# Patient Record
Sex: Male | Born: 1980 | Race: White | Hispanic: No | Marital: Single
Health system: Southern US, Community
[De-identification: ages and names within clinical notes are randomized; demographics above are authoritative.]

## PROBLEM LIST (undated history)

## (undated) DIAGNOSIS — F429 Obsessive-compulsive disorder, unspecified: Secondary | ICD-10-CM

## (undated) DIAGNOSIS — F319 Bipolar disorder, unspecified: Secondary | ICD-10-CM

## (undated) DIAGNOSIS — F411 Generalized anxiety disorder: Secondary | ICD-10-CM

## (undated) HISTORY — DX: Bipolar disorder, unspecified: F31.9

## (undated) HISTORY — DX: Obsessive-compulsive disorder, unspecified: F42.9

## (undated) HISTORY — DX: Generalized anxiety disorder: F41.1

---

## 2009-09-29 ENCOUNTER — Emergency Department (HOSPITAL_BASED_OUTPATIENT_CLINIC_OR_DEPARTMENT_OTHER): Admission: EM | Admit: 2009-09-29 | Discharge: 2009-09-29 | Payer: Self-pay | Admitting: Emergency Medicine

## 2009-09-29 ENCOUNTER — Ambulatory Visit: Payer: Self-pay | Admitting: Diagnostic Radiology

## 2011-01-16 IMAGING — CR DG CHEST 2V
2 series · 2 of 2 positions shown · non-contrast
Comparison: None

CLINICAL DATA: Fever, cough, body aches, smoker

CHEST - 2 VIEW

[w chest pa]
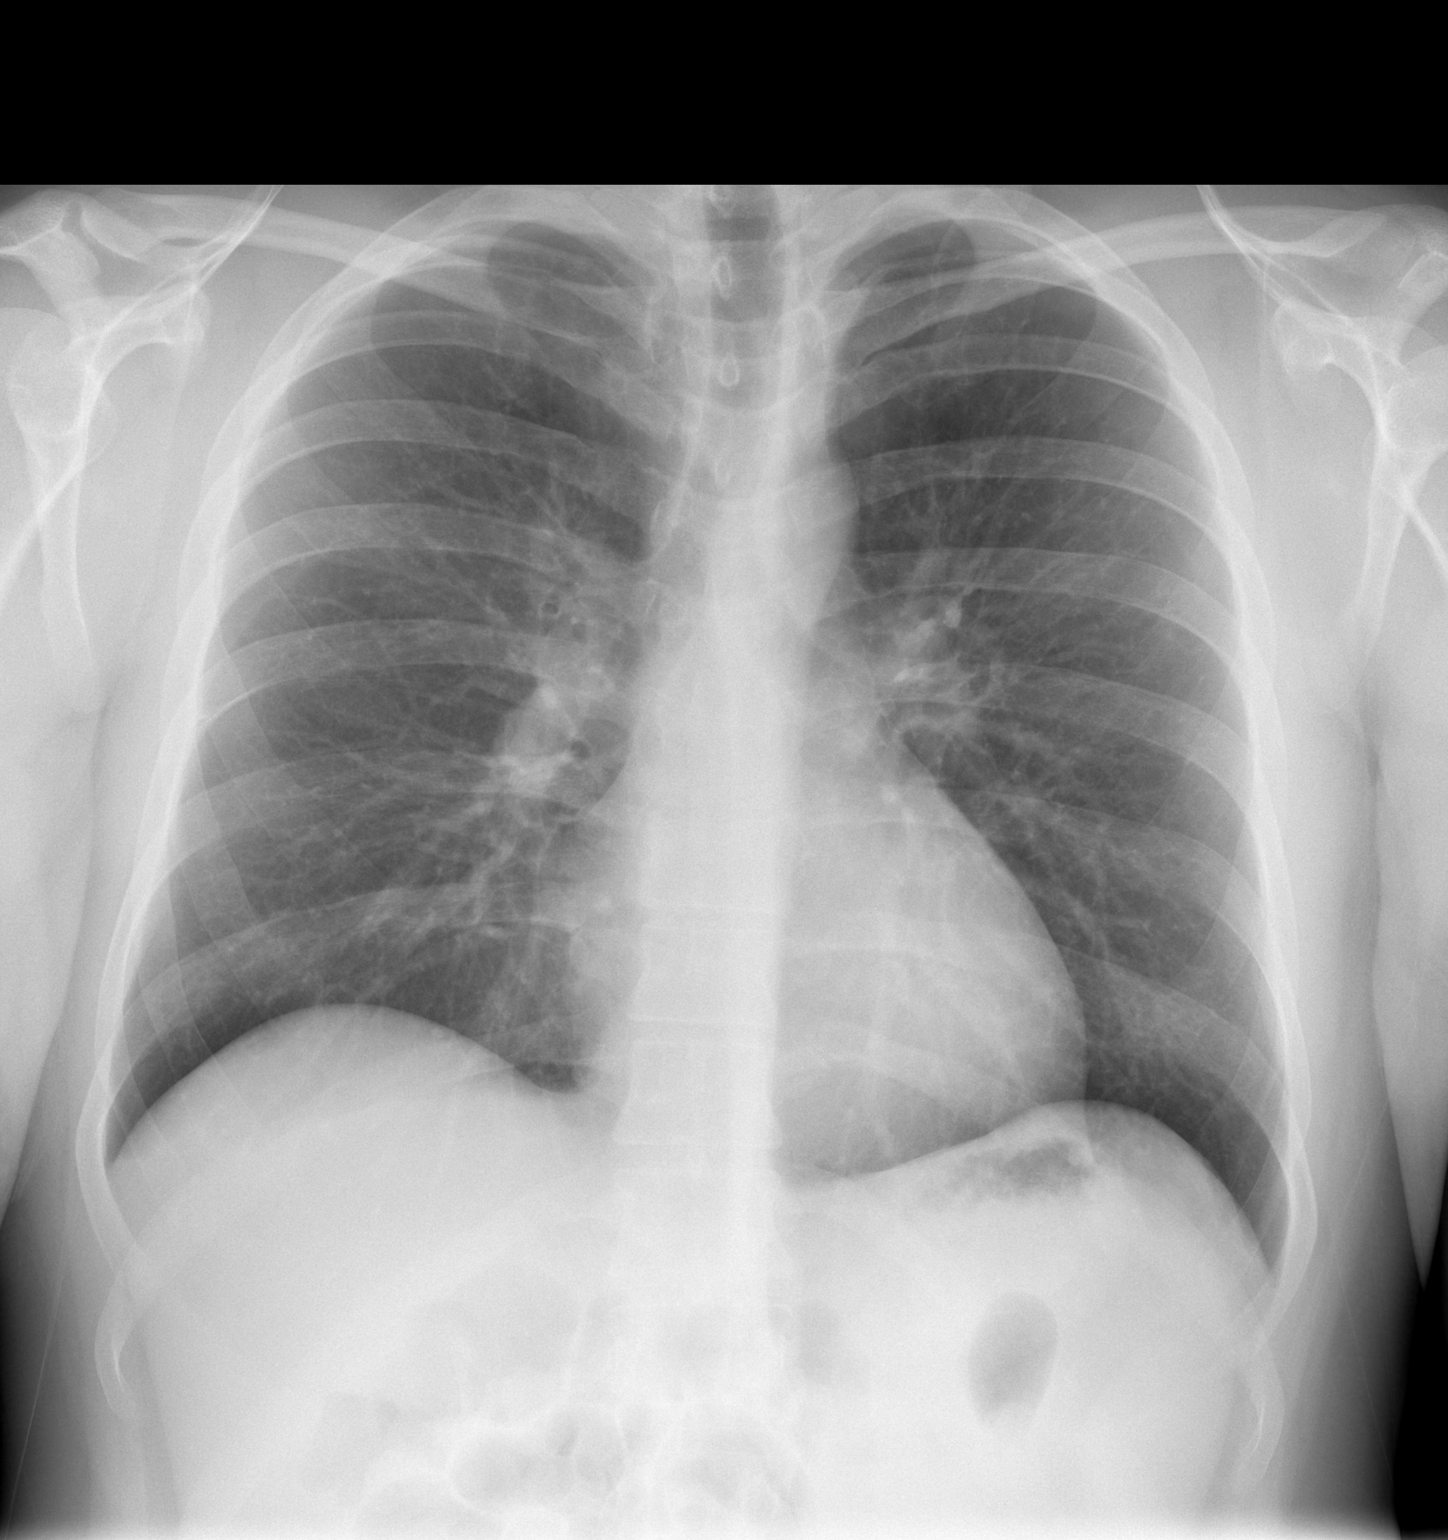

[w chest lat]
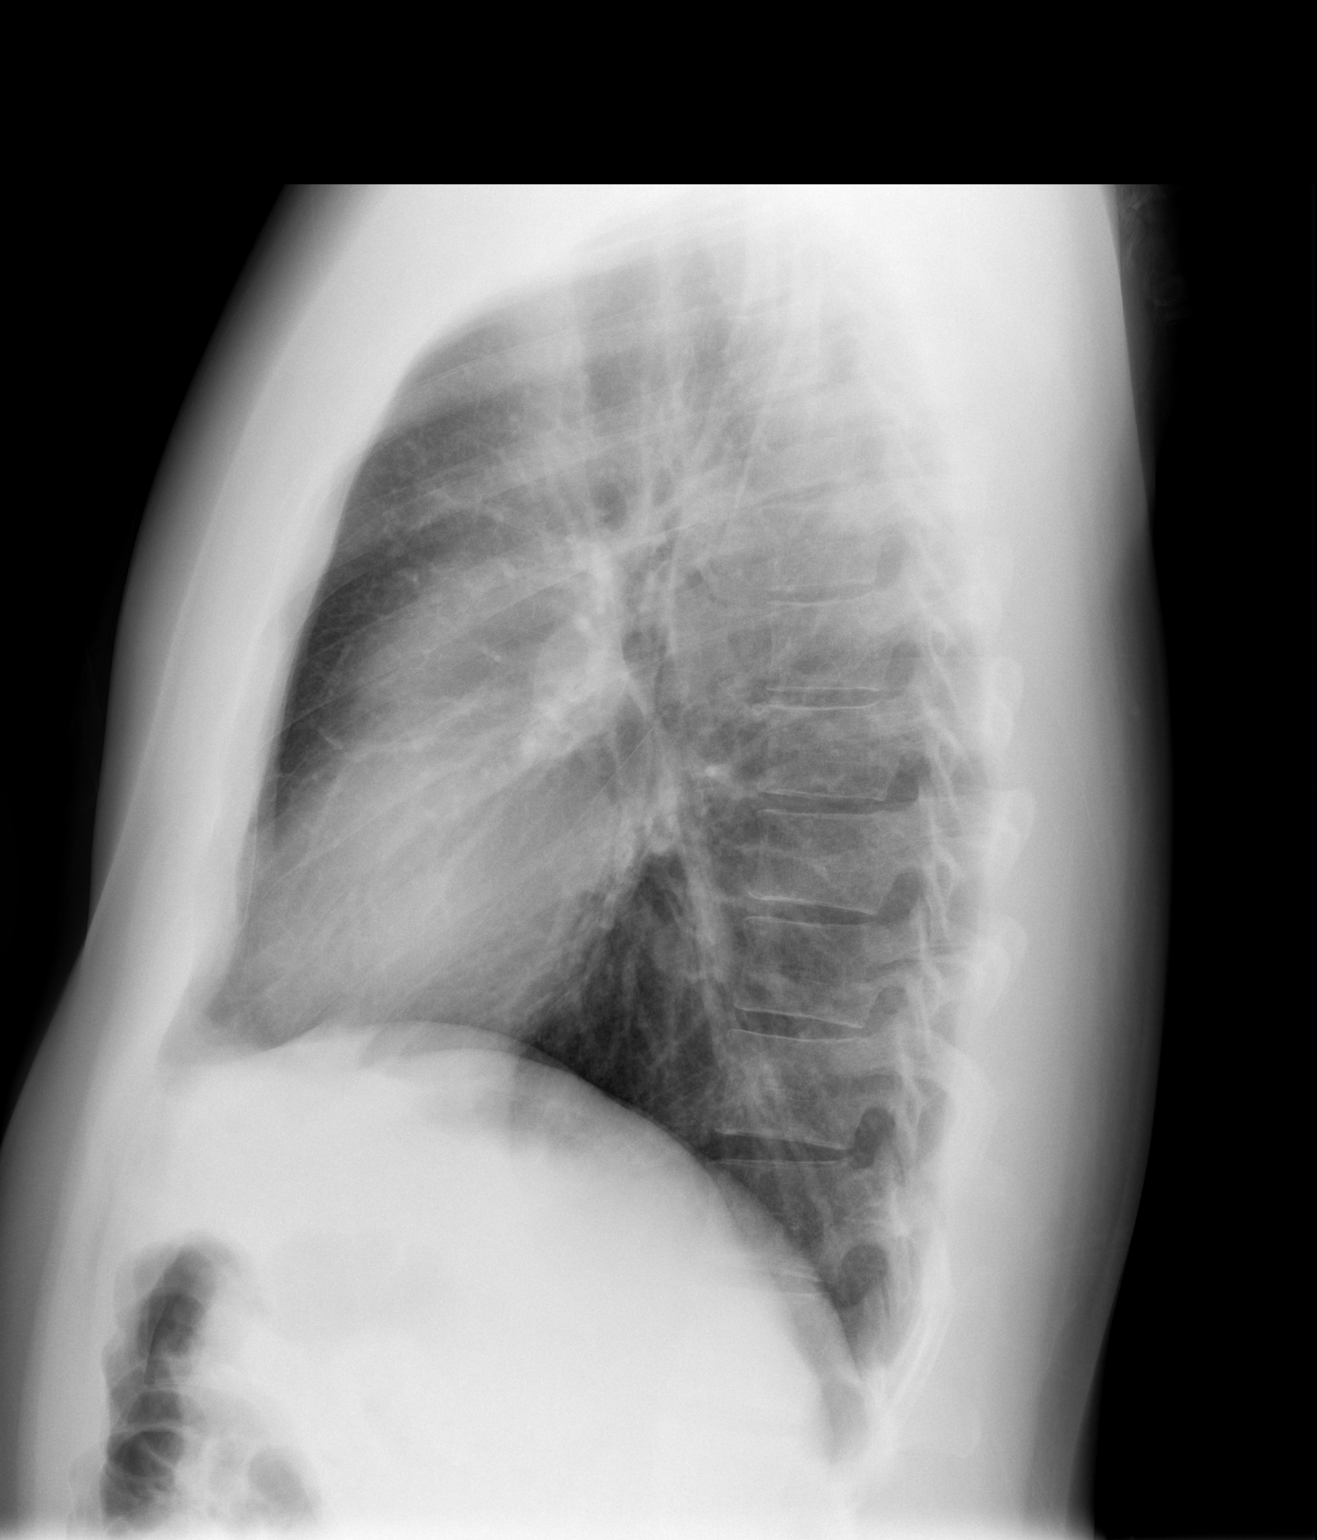

[2 of 2 positions shown; findings below may reference images not displayed]

FINDINGS: Normal heart size, mediastinal contours, and pulmonary vascularity.
Mild peribronchial thickening.
No pulmonary infiltrate, pleural effusion, or pneumothorax.
Bones unremarkable.
IMPRESSION: Mild bronchitic changes.

## 2021-11-22 ENCOUNTER — Encounter: Payer: Self-pay | Admitting: Urology

## 2021-11-22 ENCOUNTER — Ambulatory Visit: Payer: 59 | Admitting: Urology

## 2021-11-22 ENCOUNTER — Other Ambulatory Visit: Payer: Self-pay

## 2021-11-22 VITALS — BP 123/78 | HR 80 | Ht 68.0 in | Wt 188.8 lb

## 2021-11-22 DIAGNOSIS — Z3009 Encounter for other general counseling and advice on contraception: Secondary | ICD-10-CM | POA: Diagnosis not present

## 2021-11-22 DIAGNOSIS — Z87440 Personal history of urinary (tract) infections: Secondary | ICD-10-CM

## 2021-11-22 MED ORDER — DIAZEPAM 5 MG PO TABS: 5.0000 mg | ORAL_TABLET | Freq: Once | ORAL | 0 refills | Status: AC | PRN

## 2021-11-22 NOTE — Progress Notes (Signed)
? ?  11/22/21 ?11:14 AM  ? ?Kenneth Walter ?[birth year] ?[id number] ? ?CC: Discuss vasectomy, history of prostatitis ? ?HPI: ?41 year old male who is married with one 4-year-old and does not desire further biologic pregnancies.  He also has a history of prostatitis in 2017 that was managed at [facility], and he had a cystoscopy at that time which was normal.  He has had no other problems with dysuria or UTIs since that time.  I reviewed those outside notes from [facility].  He denies any family history of prostate cancer or gross hematuria.  He also has a history of a left varicocele repair as a teenager. ? ? ?PMH: ?Past Medical History:  ?Diagnosis Date  ? Bipolar 1 disorder (HCC)   ? GAD (generalized anxiety disorder)   ? OCD (obsessive compulsive disorder)   ? ? ? ?Social History:  reports that he does not currently use alcohol. He reports that he does not currently use drugs. No history on file for tobacco use. ? ?Physical Exam: ?BP 123/78 (BP Location: Left Arm, Patient Position: Sitting, Cuff Size: Normal)   Pulse 80   Ht 5\' 8"  (1.727 m)   Wt 188 lb 12.8 oz (85.6 kg)   BMI 28.71 kg/m?   ? ?Constitutional:  Alert and oriented, No acute distress. ?Cardiovascular: No clubbing, cyanosis, or edema. ?Respiratory: Normal respiratory effort, no increased work of breathing. ?GI: Abdomen is soft, nontender, nondistended, no abdominal masses ?GU: Uncircumcised phallus with patent meatus, testicles 20 cc and descended bilaterally without masses, vas deferens easily palpable ? ?Assessment & Plan:   ?41 year old male interested in vasectomy for permanent sterilization.  He also has a history of prostatitis treated at [facility] in 2017 and cystoscopy was negative at that time.  Outside notes reviewed.  He has had no further episodes of dysuria or UTIs since that time. ? ?We discussed the risks and benefits of vasectomy at length.  Vasectomy is intended to be a permanent form of contraception, and does not produce immediate sterility.  Following  vasectomy another form of contraception is required until vas occlusion is confirmed by a post-vasectomy semen analysis obtained 2-3 months after the procedure.  Even after vas occlusion is confirmed, vasectomy is not 100% reliable in preventing pregnancy, and the failure rate is approximately 08/1998.  Repeat vasectomy is required in less than 1% of patients.  He should refrain from ejaculation for 1 week after vasectomy.  Options for fertility after vasectomy include vasectomy reversal, and sperm retrieval with in vitro fertilization or ICSI.  These options are not always successful and may be expensive.  Finally, there are other permanent and non-permanent alternatives to vasectomy available. There is no risk of erectile dysfunction, and the volume of semen will be similar to prior, as the majority of the ejaculate is from the prostate and seminal vesicles.  ? ?The procedure takes ~20 minutes.  We recommend patients take 5-10 mg of Valium 30 minutes prior, and he will need a driver post-procedure.  Local anesthetic is injected into the scrotal skin and a small segment of the vas deferens is removed, and the ends occluded. The complication rate is approximately 1-2%, and includes bleeding, infection, and development of chronic scrotal pain. ? ?PLAN: ?Schedule vasectomy ?Valium sent to pharmacy ? ? ?09/1998, MD ?11/22/2021 ? ?[facility] ?[address] ?[place], [place] ?[phone]  ?

## 2021-11-22 NOTE — Patient Instructions (Signed)

## 2021-12-30 ENCOUNTER — Ambulatory Visit (INDEPENDENT_AMBULATORY_CARE_PROVIDER_SITE_OTHER): Payer: 59 | Admitting: Urology

## 2021-12-30 VITALS — BP 123/76 | HR 88 | Ht 68.0 in | Wt 188.0 lb

## 2021-12-30 DIAGNOSIS — Z302 Encounter for sterilization: Secondary | ICD-10-CM | POA: Diagnosis not present

## 2021-12-30 NOTE — Patient Instructions (Signed)

## 2021-12-30 NOTE — Progress Notes (Signed)
VASECTOMY PROCEDURE NOTE: ? ?The patient was taken to the minor procedure room and placed in the supine position. His genitals were prepped and draped in the usual sterile fashion. The right vas deferens was brought up to the skin of the right upper scrotum. The skin overlying it was anesthetized with 1% lidocaine without epinephrine, anesthetic was also injected alongside the vas deferens in the direction of the inguinal canal. The no scalpel vasectomy instrument was used to make a small perforation in the scrotal skin. The vasectomy clamp was used to grasp the vas deferens. It was carefully dissected free from surrounding structures. A 1cm segment of the vas was removed, and the cut ends of the mucosa were cauterized. A figure of eight suture was used to perform fascial interposition. No significant bleeding was noted. The vas deferens was returned to the scrotum. The skin incision was closed with a simple interrupted stitch of 4-0 chromic. ? ?Attention was then turned to the left side. The left vasectomy was performed in the same exact fashion. Sterile dressings were placed over each incision. The patient tolerated the procedure well. ? ?IMPRESSION/DIAGNOSIS: ?The patient is a 41 year old gentleman who underwent a vasectomy today. Post-procedure instructions were reviewed. I stressed the importance of continuing to use birth control until he provides a semen specimen more than 2 months from now that demonstrates azoospermia. ? ?We discussed return precautions including fever over 101, significant bleeding or hematoma, or uncontrolled pain. I also stressed the importance of avoiding strenuous activity for one week, no sexual activity or ejaculations for 5 days, intermittent icing over the next 48 hours, and scrotal support.  ? ?PLAN: ?The patient will be advised of his semen analysis results when available. ? ? ?Legrand Rams, MD ?12/30/2021 ? ?

## 2022-04-01 ENCOUNTER — Other Ambulatory Visit: Payer: BC Managed Care – PPO

## 2022-04-01 DIAGNOSIS — Z302 Encounter for sterilization: Secondary | ICD-10-CM

## 2022-04-02 LAB — POST-VAS SPERM EVALUATION,QUAL: Volume: 4.1 mL

## 2022-04-07 ENCOUNTER — Telehealth: Payer: Self-pay

## 2022-04-07 DIAGNOSIS — Z9852 Vasectomy status: Secondary | ICD-10-CM

## 2022-04-07 NOTE — Telephone Encounter (Signed)
-----   Message from Sondra Come, MD sent at 04/05/2022  8:22 AM EDT ----- Sperm still present, encourage ejaculations to clear residual sperm and repeat semen analysis in 6 weeks  Legrand Rams, MD 04/05/2022

## 2022-04-07 NOTE — Telephone Encounter (Signed)
Mychart notification sent, apt made order placed

## 2022-05-17 ENCOUNTER — Other Ambulatory Visit: Payer: BC Managed Care – PPO

## 2022-05-20 ENCOUNTER — Other Ambulatory Visit: Payer: BC Managed Care – PPO

## 2022-05-21 LAB — POST-VAS SPERM EVALUATION,QUAL: Volume: 4.3 mL

## 2024-09-03 DIAGNOSIS — R079 Chest pain, unspecified: Secondary | ICD-10-CM

## 2024-09-20 ENCOUNTER — Encounter (HOSPITAL_COMMUNITY): Payer: Self-pay

## 2024-09-20 ENCOUNTER — Other Ambulatory Visit (HOSPITAL_COMMUNITY): Payer: Self-pay
